# Patient Record
Sex: Female | Born: 1940 | Race: White | Hispanic: No | Smoking: Former smoker
Health system: Southern US, Community
[De-identification: ages and names within clinical notes are randomized; demographics above are authoritative.]

## PROBLEM LIST (undated history)

## (undated) DIAGNOSIS — I10 Essential (primary) hypertension: Secondary | ICD-10-CM

## (undated) DIAGNOSIS — E119 Type 2 diabetes mellitus without complications: Secondary | ICD-10-CM

## (undated) DIAGNOSIS — E7800 Pure hypercholesterolemia, unspecified: Secondary | ICD-10-CM

## (undated) HISTORY — PX: ABDOMINAL AORTIC ANEURYSM REPAIR: SUR1152

---
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
## 1998-05-01 ENCOUNTER — Encounter: Admission: RE | Admit: 1998-05-01 | Discharge: 1998-07-30 | Payer: Self-pay | Admitting: Family Medicine

## 1999-11-19 HISTORY — PX: ABDOMINAL HYSTERECTOMY: SHX81

## 2001-04-09 ENCOUNTER — Encounter: Payer: Self-pay | Admitting: Obstetrics and Gynecology

## 2001-04-09 ENCOUNTER — Ambulatory Visit (HOSPITAL_COMMUNITY): Admission: RE | Admit: 2001-04-09 | Discharge: 2001-04-09 | Payer: Self-pay | Admitting: Obstetrics and Gynecology

## 2001-04-21 ENCOUNTER — Other Ambulatory Visit: Admission: RE | Admit: 2001-04-21 | Discharge: 2001-04-21 | Payer: Self-pay | Admitting: Obstetrics and Gynecology

## 2001-05-08 ENCOUNTER — Other Ambulatory Visit: Admission: RE | Admit: 2001-05-08 | Discharge: 2001-05-08 | Payer: Self-pay | Admitting: Obstetrics and Gynecology

## 2001-06-04 ENCOUNTER — Encounter: Payer: Self-pay | Admitting: Obstetrics and Gynecology

## 2001-06-10 ENCOUNTER — Inpatient Hospital Stay (HOSPITAL_COMMUNITY): Admission: RE | Admit: 2001-06-10 | Discharge: 2001-06-12 | Payer: Self-pay | Admitting: Obstetrics and Gynecology

## 2004-11-18 HISTORY — PX: LAPAROSCOPIC GASTRIC BANDING: SHX1100

## 2005-04-17 ENCOUNTER — Encounter: Admission: RE | Admit: 2005-04-17 | Discharge: 2005-07-16 | Payer: Self-pay | Admitting: General Surgery

## 2005-04-19 ENCOUNTER — Ambulatory Visit (HOSPITAL_COMMUNITY): Admission: RE | Admit: 2005-04-19 | Discharge: 2005-04-19 | Payer: Self-pay | Admitting: General Surgery

## 2005-04-25 ENCOUNTER — Ambulatory Visit (HOSPITAL_COMMUNITY): Admission: RE | Admit: 2005-04-25 | Discharge: 2005-04-25 | Payer: Self-pay | Admitting: General Surgery

## 2005-06-04 ENCOUNTER — Observation Stay (HOSPITAL_COMMUNITY): Admission: RE | Admit: 2005-06-04 | Discharge: 2005-06-05 | Payer: Self-pay | Admitting: General Surgery

## 2005-07-17 ENCOUNTER — Encounter: Admission: RE | Admit: 2005-07-17 | Discharge: 2005-10-15 | Payer: Self-pay | Admitting: General Surgery

## 2005-09-26 ENCOUNTER — Encounter: Admission: RE | Admit: 2005-09-26 | Discharge: 2005-09-26 | Payer: Self-pay | Admitting: General Surgery

## 2005-12-16 ENCOUNTER — Encounter: Admission: RE | Admit: 2005-12-16 | Discharge: 2006-03-16 | Payer: Self-pay | Admitting: General Surgery

## 2006-09-15 IMAGING — CR DG ABDOMEN 2V
3 series · 3 of 3 positions shown · non-contrast
Comparison: none

CLINICAL DATA: Check position of lap band and port.
 ABDOMEN:
 Two views of the abdomen were obtained. The banding appears to be in the region of the gastroesophageal junction.  However, the port on the frontal and lateral views appears to be pointed inferiorly and posteriorly and therefore would not be in proper position for percutaneous access.  The bowel gas pattern is nonspecific.  Probable right renal calculus is noted.

[t abdomen supine (1 of 3)]
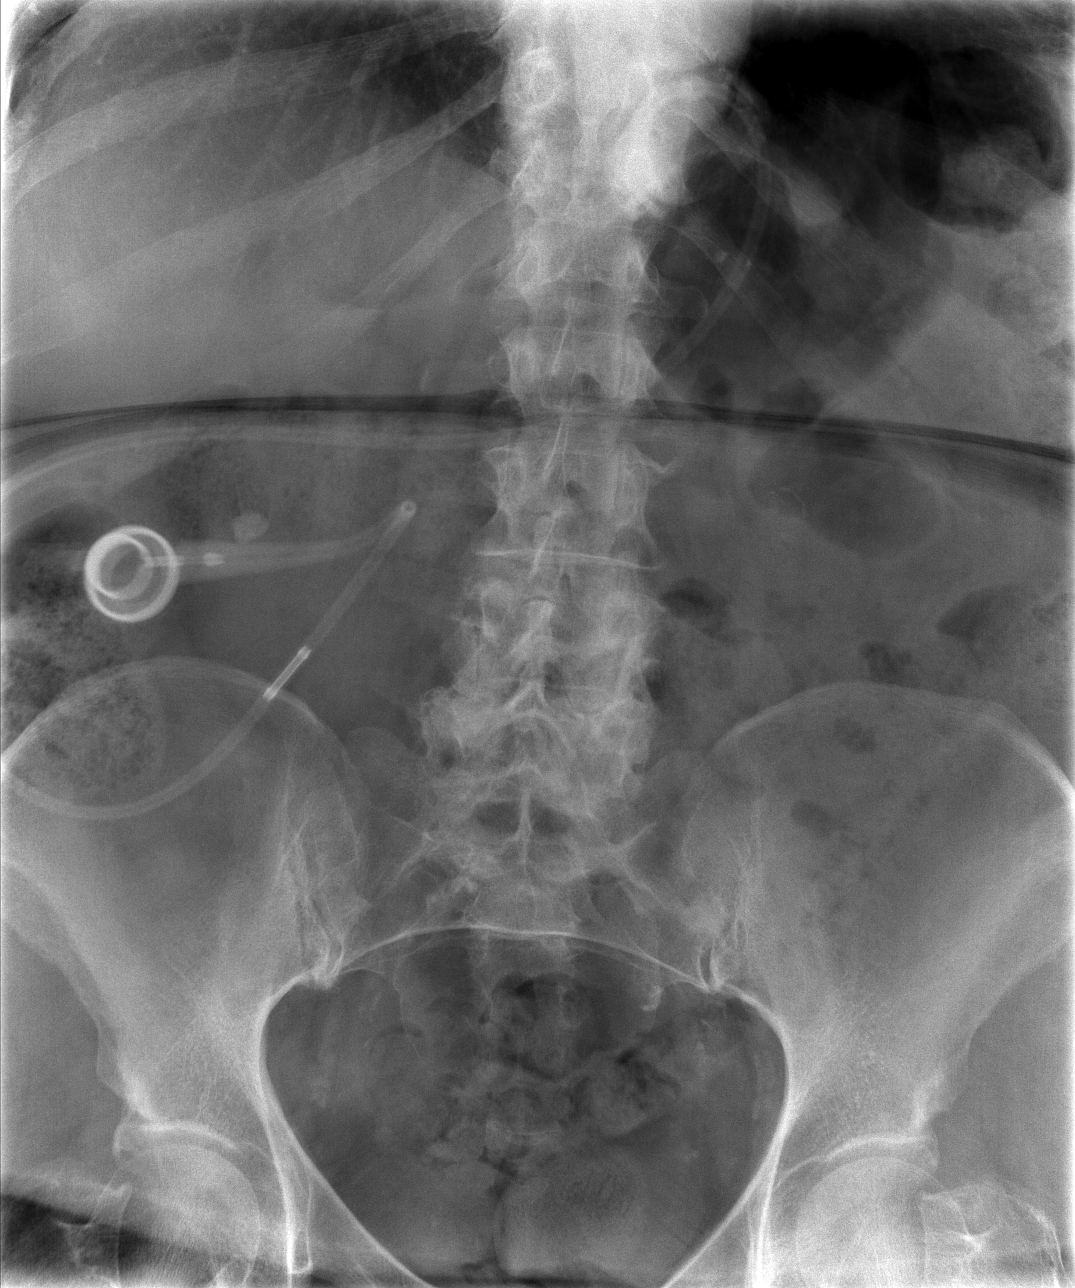

[t abdomen supine (2 of 3)]
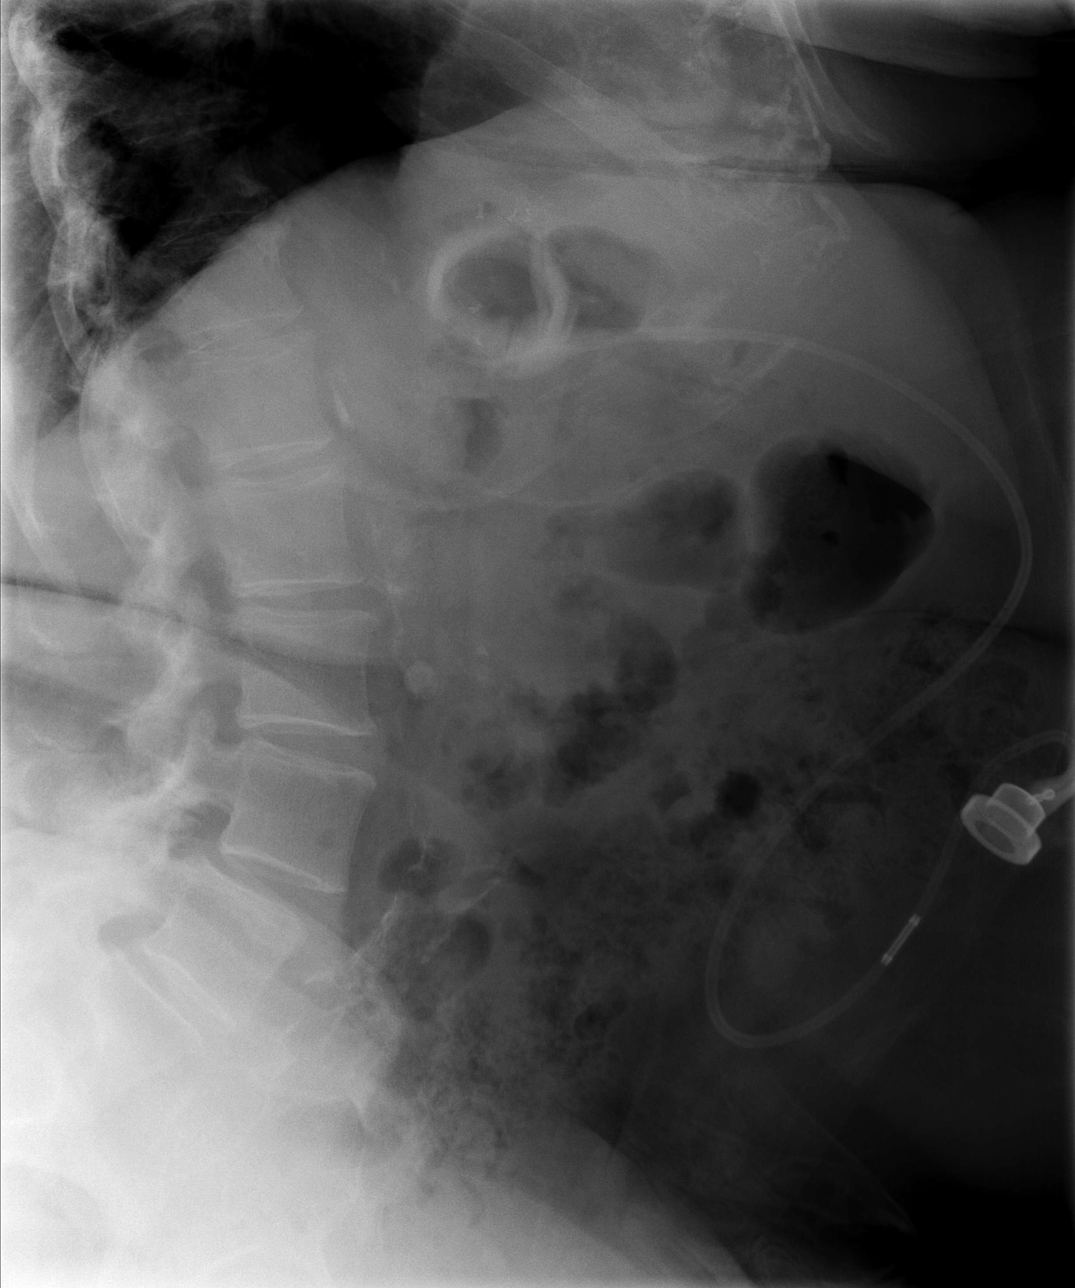

[t abdomen supine (3 of 3)]
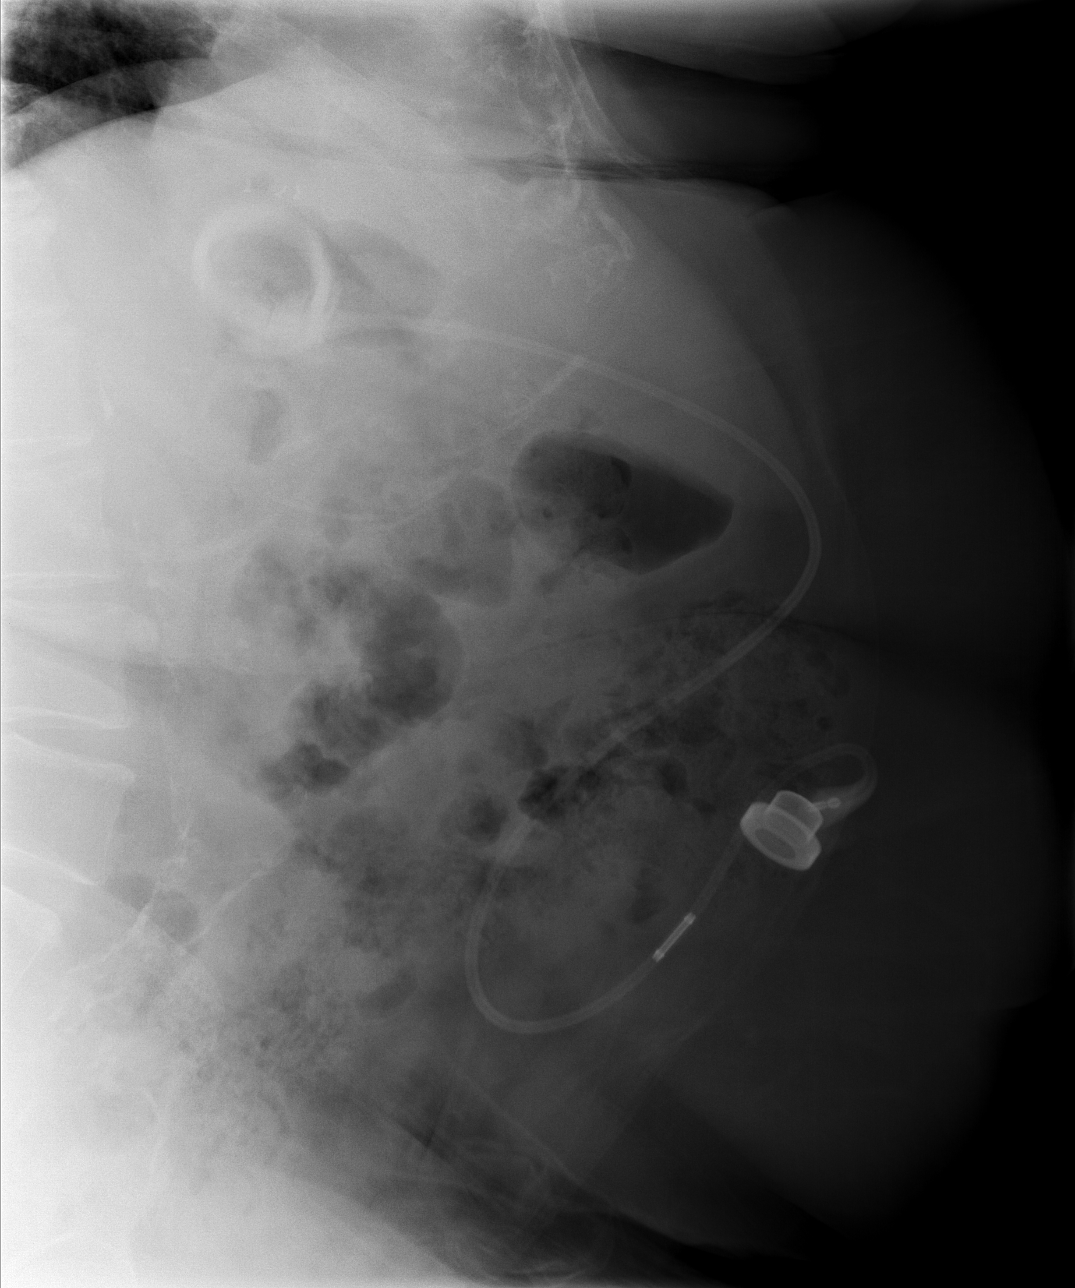

[3 of 3 positions shown; findings below may reference images not displayed]

IMPRESSION: The port of the lap band appears to be pointed posteriorly and inferiorly and therefore would not be able to be accessed percutaneously.

## 2016-02-17 HISTORY — PX: HERNIA REPAIR: SHX51

## 2016-10-09 ENCOUNTER — Emergency Department (HOSPITAL_COMMUNITY)
Admission: EM | Admit: 2016-10-09 | Discharge: 2016-10-09 | Disposition: A | Discharge status: Home / Self Care | Payer: Medicare Other | Attending: Emergency Medicine | Admitting: Emergency Medicine

## 2016-10-09 ENCOUNTER — Encounter (HOSPITAL_COMMUNITY): Payer: Self-pay

## 2016-10-09 DIAGNOSIS — Y929 Unspecified place or not applicable: Secondary | ICD-10-CM | POA: Insufficient documentation

## 2016-10-09 DIAGNOSIS — R55 Syncope and collapse: Secondary | ICD-10-CM | POA: Diagnosis not present

## 2016-10-09 DIAGNOSIS — S40022A Contusion of left upper arm, initial encounter: Secondary | ICD-10-CM | POA: Diagnosis not present

## 2016-10-09 DIAGNOSIS — X58XXXA Exposure to other specified factors, initial encounter: Secondary | ICD-10-CM | POA: Diagnosis not present

## 2016-10-09 DIAGNOSIS — I10 Essential (primary) hypertension: Secondary | ICD-10-CM | POA: Insufficient documentation

## 2016-10-09 DIAGNOSIS — S4992XA Unspecified injury of left shoulder and upper arm, initial encounter: Secondary | ICD-10-CM | POA: Diagnosis present

## 2016-10-09 DIAGNOSIS — E119 Type 2 diabetes mellitus without complications: Secondary | ICD-10-CM | POA: Insufficient documentation

## 2016-10-09 DIAGNOSIS — Y999 Unspecified external cause status: Secondary | ICD-10-CM | POA: Insufficient documentation

## 2016-10-09 DIAGNOSIS — Y939 Activity, unspecified: Secondary | ICD-10-CM | POA: Diagnosis not present

## 2016-10-09 DIAGNOSIS — Z87891 Personal history of nicotine dependence: Secondary | ICD-10-CM | POA: Insufficient documentation

## 2016-10-09 HISTORY — DX: Pure hypercholesterolemia, unspecified: E78.00

## 2016-10-09 HISTORY — DX: Essential (primary) hypertension: I10

## 2016-10-09 HISTORY — DX: Type 2 diabetes mellitus without complications: E11.9

## 2016-10-09 LAB — I-STAT CHEM 8, ED
BUN: 31 mg/dL — ABNORMAL HIGH (ref 6–20)
CHLORIDE: 103 mmol/L (ref 101–111)
Calcium, Ion: 1.14 mmol/L — ABNORMAL LOW (ref 1.15–1.40)
Creatinine, Ser: 1 mg/dL (ref 0.44–1.00)
GLUCOSE: 113 mg/dL — AB (ref 65–99)
HEMATOCRIT: 40 % (ref 36.0–46.0)
HEMOGLOBIN: 13.6 g/dL (ref 12.0–15.0)
POTASSIUM: 3.7 mmol/L (ref 3.5–5.1)
SODIUM: 139 mmol/L (ref 135–145)
TCO2: 26 mmol/L (ref 0–100)

## 2016-10-09 NOTE — ED Triage Notes (Addendum)
Per [facility], Pt is coming from PCP where she was being checked out for some edema and bruising to the left arm. During examination, Pt was sitting on a bed when she went unresponsive for twenty second after PCP pushed on arm and patient was pushing up and down on the MD. She fell back into the bed into supine position. When EMS arrived, Pt was able to sit and stand without any complaints. Pt is alert and oriented x4 upon arrival to the ED. Vitals per EMS: 76 HR, 140/82, 90%, 164 CBG.

## 2016-10-09 NOTE — ED Provider Notes (Signed)
[facility]-EMERGENCY DEPT Provider Note   CSN: [id number] Arrival date & time: 10/09/16  1725     History   Chief Complaint Chief Complaint  Patient presents with  . Loss of Consciousness    HPI Tina Moore is a 75 y.o. female.   Loss of Consciousness   This is a new problem. The current episode started 1 to 2 hours ago. The problem occurs rarely. The problem has been resolved. She lost consciousness for a period of less than one minute. Associated with: pain. Associated symptoms include diaphoresis, light-headedness and nausea. Pertinent negatives include abdominal pain, back pain, bladder incontinence, chest pain, dizziness, fever, palpitations, seizures, visual change and vomiting. She has tried nothing for the symptoms. The treatment provided significant relief.    Past Medical History:  Diagnosis Date  . Diabetes mellitus without complication (HCC)   . High cholesterol   . Hypertension     There are no active problems to display for this patient.   Past Surgical History:  Procedure Laterality Date  . ABDOMINAL AORTIC ANEURYSM REPAIR    . ABDOMINAL HYSTERECTOMY  2001  . APPENDECTOMY  1961  . HERNIA REPAIR  02/2016  . LAPAROSCOPIC GASTRIC BANDING  2006    OB History    No data available       Home Medications    Prior to Admission medications   Not on File    Family History No family history on file.  Social History Social History  Substance Use Topics  . Smoking status: Former Games developermoker  . Smokeless tobacco: Never Used  . Alcohol use No     Allergies   Patient has no known allergies.   Review of Systems Review of Systems  Constitutional: Positive for diaphoresis. Negative for chills and fever.  HENT: Negative for ear pain and sore throat.   Eyes: Negative for pain and visual disturbance.  Respiratory: Negative for cough and shortness of breath.   Cardiovascular: Positive for syncope. Negative for chest pain and palpitations.    Gastrointestinal: Positive for nausea. Negative for abdominal pain and vomiting.  Genitourinary: Negative for bladder incontinence, dysuria and hematuria.  Musculoskeletal: Negative for arthralgias and back pain.  Skin: Negative for color change and rash.  Neurological: Positive for light-headedness. Negative for dizziness, seizures and syncope.  Hematological: Bruises/bleeds easily.  All other systems reviewed and are negative.    Physical Exam Updated Vital Signs BP 142/78 (BP Location: Right Arm)   Pulse 62   Temp 98.6 F (37 C) (Oral)   Resp 24   Ht 5\' 3"  (1.6 m)   Wt 101.6 kg   SpO2 99%   BMI 39.68 kg/m   Physical Exam  Constitutional: She is oriented to person, place, and time. She appears well-developed and well-nourished. No distress.  HENT:  Head: Normocephalic and atraumatic.  Eyes: Conjunctivae are normal.  Neck: Neck supple.  Cardiovascular: Normal rate and regular rhythm.   No murmur heard. Pulmonary/Chest: Effort normal and breath sounds normal. No respiratory distress.  Abdominal: Soft. There is no tenderness.  Musculoskeletal: She exhibits no edema.       Left elbow: She exhibits swelling.       Arms: Neurological: She is alert and oriented to person, place, and time. She has normal strength. No cranial nerve deficit or sensory deficit. She exhibits abnormal muscle tone. Coordination and gait normal. GCS eye subscore is 4. GCS verbal subscore is 5. GCS motor subscore is 6.  Skin: Skin is warm and dry.  Psychiatric: She has a normal mood and affect.  Nursing note and vitals reviewed.    ED Treatments / Results  Labs (all labs ordered are listed, but only abnormal results are displayed) Labs Reviewed  I-STAT CHEM 8, ED - Abnormal; Notable for the following:       Result Value   BUN 31 (*)    Glucose, Bld 113 (*)    Calcium, Ion 1.14 (*)    All other components within normal limits    EKG  EKG Interpretation  Date/Time:  Wednesday October 09 2016 17:39:41 EST Ventricular Rate:  69 PR Interval:    QRS Duration: 163 QT Interval:  443 QTC Calculation: 475 R Axis:   83 Text Interpretation:  Sinus rhythm Multiple premature complexes, vent & supraven Right bundle branch block No significant change since last tracing Confirmed by Pride MedicalCHLOSSMAN MD, Denny PeonERIN ([id number]) on 10/09/2016 6:44:03 PM       Radiology No results found.  Procedures Procedures (including critical care time)  EMERGENCY DEPARTMENT ULTRASOUND  Study: Limited Retroperitoneal Ultrasound of the Abdominal Aorta.  INDICATIONS:Hx of AAA Multiple views of the abdominal aorta were obtained in real-time from the diaphragmatic hiatus to the aortic bifurcation in transverse planes with a multi-frequency probe. PERFORMED BY: Myself IMAGES ARCHIVED?: Yes FINDINGS: Maximum aortic dimensions are 7.3 in sagital mid Aorta LIMITATIONS:  none INTERPRETATION:  Abdominal aortic aneurysm present  Medications Ordered in ED Medications - No data to display   Initial Impression / Assessment and Plan / ED Course  I have reviewed the triage vital signs and the nursing notes.  Pertinent labs & imaging results that were available during my care of the patient were reviewed by me and considered in my medical decision making (see chart for details).  Clinical Course     75 year old female past medical history of DVT, AAA comes today after syncopal episode. She was at her doctor's office getting a hematoma evaluated. He was being examined by the physician she said this was quite painful during the exam she became flush, nauseous, diaphoretic, lightheaded syncopized. She says this lasted less than 1 minute. Afterward she is back to her baseline and the hematoma no longer hurt. Vital signs are stable at time of arrival. She is afebrile. She is alert and oriented. She has no neurologic dysfunction cranial nerves, motor nerves, sensation, coordination. She is able to ambulate normally. She has no  tenderness to palpation of the abdomen there is a pulsatile mass. 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 Patient says she last remembers aneurysm being about 8 cm. Review of her medical records from another hospital does show a aneurysm at 7.9 cm. Patient's syncopal episode was likely a vagal episode setting of a painful exam. EKG is unremarkable any acute ischemic changes interval abnormalities or arrhythmia. I-STAT Chem-8 is unremarkable for any causes of syncopal episode. Patient is safe for discharge home with strict return precautions. Vital signs stable toe discharge.  Final Clinical Impressions(s) / ED Diagnoses   Final diagnoses:  Syncope and collapse  Contusion of left upper arm, initial encounter    New Prescriptions There are no discharge medications for this patient.    Cherlynn PerchesEric Teejay Meader, MD 10/10/16 [id number]    Alvira MondayErin Schlossman, MD 10/11/16 [phone]

## 2020-04-18 DEATH — deceased
